# Patient Record
Sex: Male | Born: 1945 | Race: White | Hispanic: No | Marital: Married | Smoking: Never smoker
Health system: Southern US, Community
[De-identification: ages and names within clinical notes are randomized; demographics above are authoritative.]

## PROBLEM LIST (undated history)

## (undated) DIAGNOSIS — E119 Type 2 diabetes mellitus without complications: Secondary | ICD-10-CM

## (undated) DIAGNOSIS — Z8719 Personal history of other diseases of the digestive system: Secondary | ICD-10-CM

## (undated) DIAGNOSIS — G219 Secondary parkinsonism, unspecified: Secondary | ICD-10-CM

## (undated) DIAGNOSIS — I10 Essential (primary) hypertension: Secondary | ICD-10-CM

## (undated) DIAGNOSIS — E538 Deficiency of other specified B group vitamins: Secondary | ICD-10-CM

## (undated) DIAGNOSIS — F419 Anxiety disorder, unspecified: Secondary | ICD-10-CM

## (undated) DIAGNOSIS — F319 Bipolar disorder, unspecified: Secondary | ICD-10-CM

## (undated) DIAGNOSIS — E785 Hyperlipidemia, unspecified: Secondary | ICD-10-CM

## (undated) HISTORY — PX: COLONOSCOPY WITH PROPOFOL: SHX5780

## (undated) HISTORY — PX: AMPUTATION TOE: SHX6595

## (undated) HISTORY — PX: INSERT / REPLACE / REMOVE PACEMAKER: SUR710

## (undated) HISTORY — PX: APPENDECTOMY: SHX54

---

## 2006-02-10 ENCOUNTER — Ambulatory Visit: Payer: Self-pay | Admitting: Unknown Physician Specialty

## 2013-08-06 ENCOUNTER — Ambulatory Visit: Payer: Self-pay | Admitting: Neurology

## 2013-10-09 ENCOUNTER — Ambulatory Visit: Payer: Self-pay | Admitting: Cardiology

## 2013-10-09 LAB — BASIC METABOLIC PANEL
Anion Gap: 2 — ABNORMAL LOW (ref 7–16)
BUN: 14 mg/dL (ref 7–18)
Calcium, Total: 9.6 mg/dL (ref 8.5–10.1)
Chloride: 108 mmol/L — ABNORMAL HIGH (ref 98–107)
Co2: 30 mmol/L (ref 21–32)
Creatinine: 1.06 mg/dL (ref 0.60–1.30)
EGFR (African American): >60
EGFR (Non-African Amer.): >60
Glucose: 73 mg/dL (ref 65–99)
Osmolality: 278 (ref 275–301)
Potassium: 4.1 mmol/L (ref 3.5–5.1)
Sodium: 140 mmol/L (ref 136–145)

## 2013-10-09 LAB — CBC WITH DIFFERENTIAL/PLATELET
Basophil #: 0 10*3/uL (ref 0.0–0.1)
Basophil %: 0.3 %
Eosinophil %: 0.7 %
HCT: 44.1 % (ref 40.0–52.0)
HGB: 15.1 g/dL (ref 13.0–18.0)
Lymphocyte %: 20 %
MCH: 30.6 pg (ref 26.0–34.0)
MCHC: 34.3 g/dL (ref 32.0–36.0)
MCV: 89 fL (ref 80–100)
Monocyte #: 0.7 x10 3/mm (ref 0.2–1.0)
Monocyte %: 8.1 %
Platelet: 204 10*3/uL (ref 150–440)
RDW: 14.5 % (ref 11.5–14.5)

## 2013-10-09 LAB — PROTIME-INR: Prothrombin Time: 13.2 secs (ref 11.5–14.7)

## 2013-10-09 LAB — APTT: Activated PTT: 28.6 secs (ref 23.6–35.9)

## 2013-10-15 ENCOUNTER — Ambulatory Visit: Payer: Self-pay | Admitting: Cardiology

## 2014-02-11 DIAGNOSIS — C4491 Basal cell carcinoma of skin, unspecified: Secondary | ICD-10-CM

## 2014-02-11 HISTORY — DX: Basal cell carcinoma of skin, unspecified: C44.91

## 2014-03-04 IMAGING — CR DG CHEST 1V PORT
1 series · 2 of 2 positions shown · non-contrast
Comparison: 10/09/2013

CLINICAL DATA: Pacemaker insertion

EXAM:
PORTABLE CHEST - 1 VIEW

[Series 1: ap · 0.17mm/px · 2 of 2 slices shown]
[im 1/2]
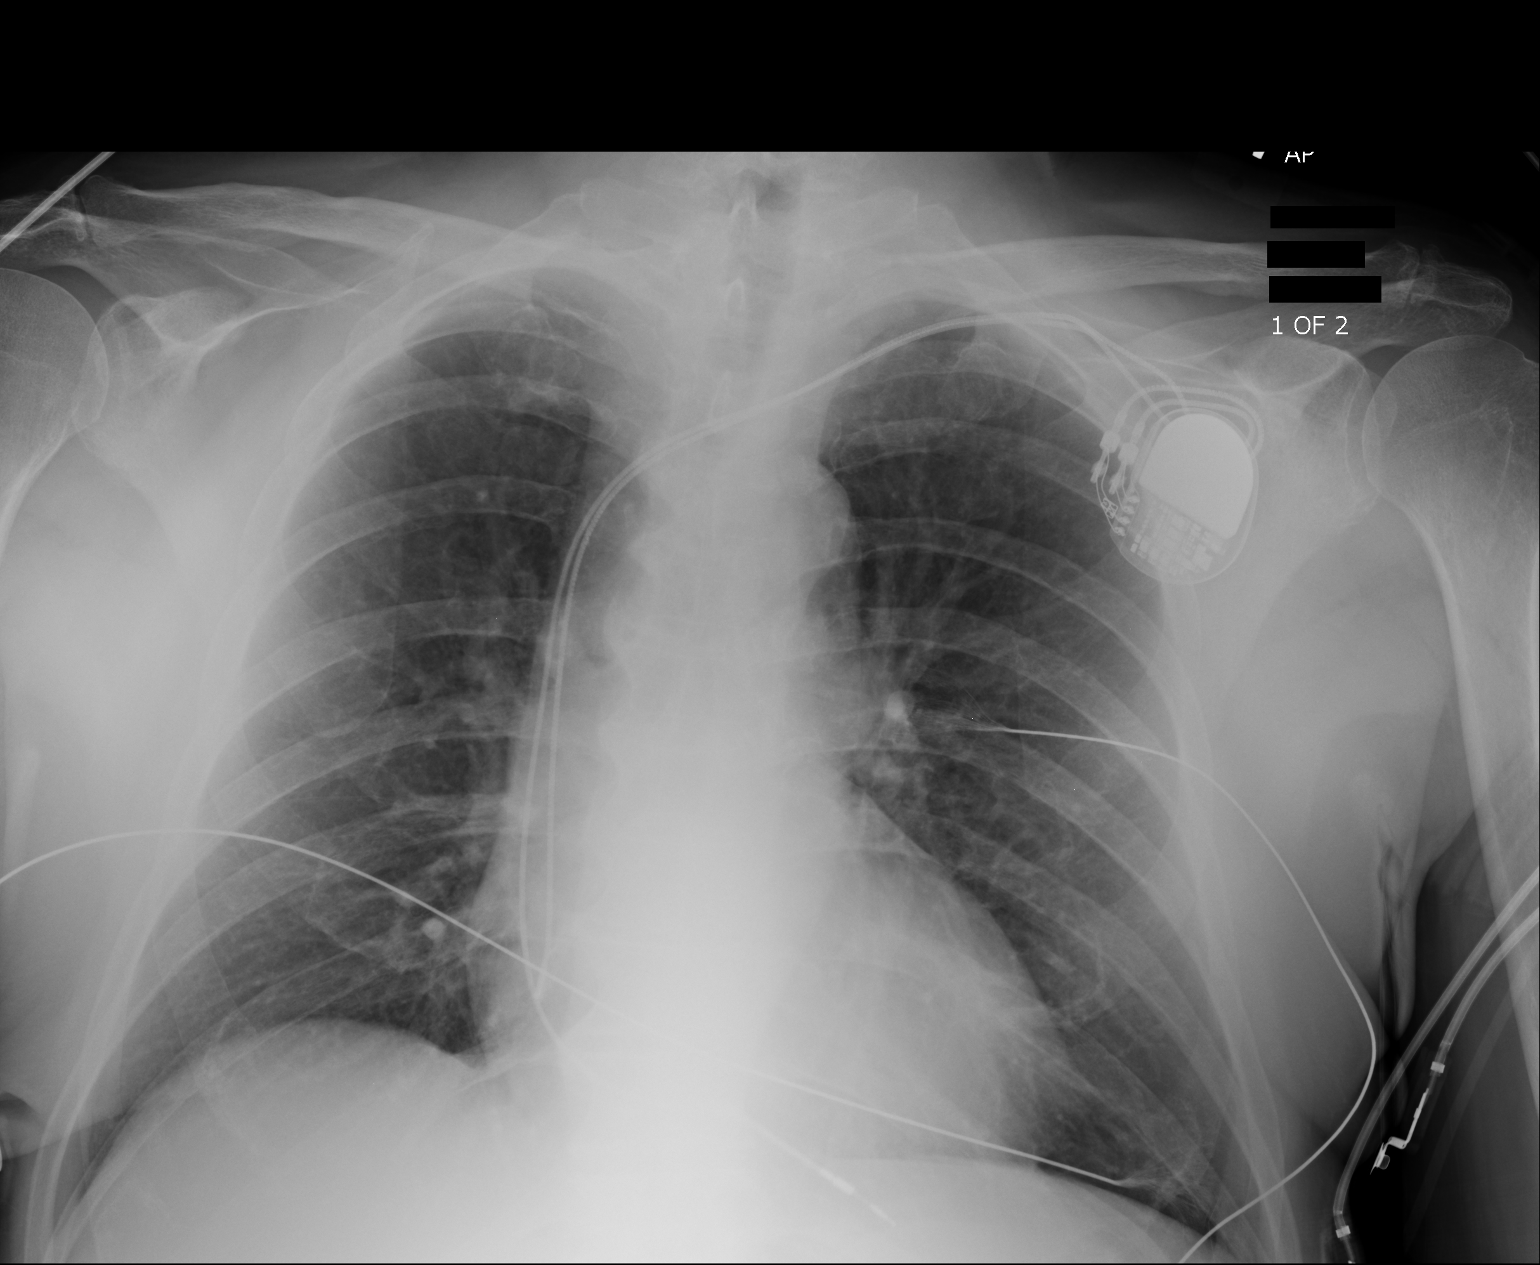
[im 2/2]
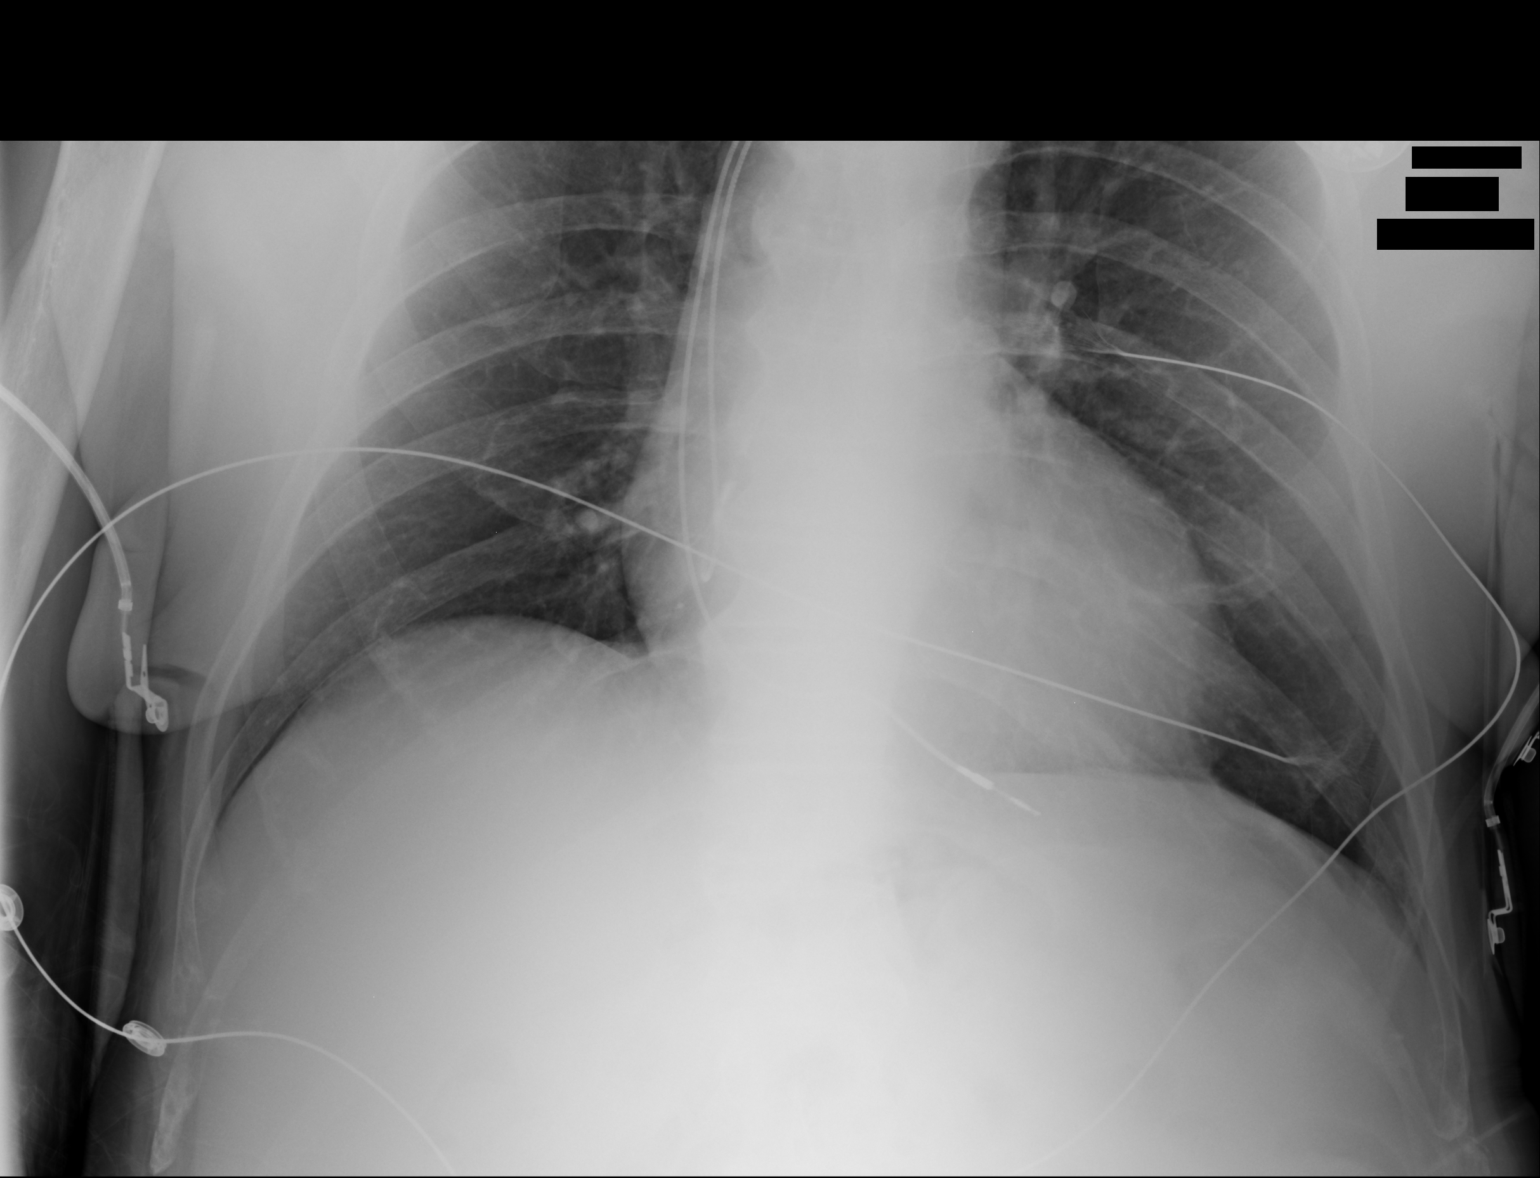

[2 of 2 positions shown; findings below may reference images not displayed]

FINDINGS: The subclavian 2 lead pacer has been inserted. No pneumothorax or
effusion. Stable heart size and vascularity. No CHF or pneumonia.
Degenerative changes of the spine. Tortuous aorta evident.
IMPRESSION: New left subclavian pacer.  No acute finding.

## 2014-06-25 ENCOUNTER — Ambulatory Visit: Payer: Self-pay | Admitting: Unknown Physician Specialty

## 2014-06-27 LAB — PATHOLOGY REPORT

## 2015-03-20 NOTE — Op Note (Signed)
PATIENT NAME:  Michael Bradshaw, Michael Bradshaw MR#:  [id number] DATE OF BIRTH:  1946-09-12  DATE OF PROCEDURE:  10/15/2013  PRIMARY CARE PHYSICIAN: Dr. Caryl Comes.  PREPROCEDURE DIAGNOSIS: Sick sinus syndrome.   PROCEDURE: Dual chamber pacemaker implantation.   POSTPROCEDURE DIAGNOSIS: Atrial pacing with ventricular sensing.   INDICATION: The patient is a 69 year old gentleman with known history of bradycardia. Recently noted to be bradycardic with heart rates in the 40s and 50s. A 24 hour Holter monitor was performed which revealed a mean heart rate of 44 bpm with a maximum heart rate of only 65 bpm consistent with poor chronotropic competence. The patient does experience generalized fatigue and leads a sedentary lifestyle. The procedure, risks, benefits, and alternatives of permanent pacemaker implantation were explained to the patient and informed written consent was obtained.   DESCRIPTION OF PROCEDURE: He was brought to the operating room in a fasting state. The left pectoral region was prepped and prepped in the usual sterile manner. Anesthesia was obtained with 1% Xylocaine locally. A 6 cm incision was performed over the left pectoral region. The pacemaker pocket was generated by electrocautery and blunt dissection. Access was obtained to the left subclavian vein by fine needle aspiration. Right ventricular and right atrial leads were positioned to right ventricular apex and right atrial appendage under fluoroscopic guidance. After proper thresholds were obtained, the leads were sutured in place. The pacemaker pocket was irrigated with gentamicin solution. The leads were connected to a rate-responsive dual-chamber pacemaker generator (Medtronic Adapta [id number]) and positioned into the pocket. The pocket was closed with 2-0 and 4-0 Vicryl, respectively. Steri-Strips and a pressure dressing were applied.  ____________________________ Isaias Cowman, MD ap:sb D: 10/15/2013 13:26:09 ET T: 10/15/2013 13:33:59  ET JOB#: [id number]  cc: Isaias Cowman, MD, <Dictator> Isaias Cowman MD ELECTRONICALLY SIGNED 10/16/2013 9:35

## 2015-03-20 NOTE — Op Note (Signed)
PATIENT NAME:  Michael Bradshaw, Michael Bradshaw MR#:  [id number] DATE OF BIRTH:  1946/07/27  DATE OF PROCEDURE:  10/15/2013  PRIMARY CARE PHYSICIAN: Tama High III, MD  PREPROCEDURE DIAGNOSIS:  Atrial lead malfunction.   PROCEDURE: Atrial lead revision.   POSTPROCEDURE DIAGNOSIS: Atrial pacing with ventricular sensing.   INDICATION: The patient is a 69 year old gentleman who underwent dual-chamber pacemaker implantation earlier today. While in recovery, it was noted that the atrial lead was inappropriately sensing and not pacing.   DESCRIPTION OF PROCEDURE: The  risks, benefits, and alternatives of atrial lead revision were explained to the patient, and informed consent was obtained. He was brought to the operating room in a fasting state. The left pectoral region was prepped and draped in the usual sterile manner. Anesthesia was obtained with 1% Xylocaine locally. The pacemaker pocket was reopened, and the atrial lead was disconnected from the pacemaker generator. After thresholds were obtained, it was felt that a screw-in atrial lead would be most appropriate. Access was obtained to the left subclavian vein by fine needle aspiration. Atrial screw-in lead (5076-52 cm) was positioned to the right atrial appendage under fluoroscopic guidance. After the proper threshold was obtained, the lead was sutured in place. The atrial lead was connected to the dual-chamber pacemaker generator and repositioned. The pacemaker pocket was irrigated with gentamicin solution. The pacemaker generator was repositioned in the pocket, and the pocket was closed with 2-0 and 4-0 Vicryl, respectively. Steri-Strips and pressure dressing were applied.    ____________________________ Isaias Cowman, MD ap:jcm D: 10/15/2013 16:42:36 ET T: 10/15/2013 17:02:50 ET JOB#: [id number]  cc: Isaias Cowman, MD, <Dictator> Isaias Cowman MD ELECTRONICALLY SIGNED 10/16/2013 9:35

## 2016-02-02 DIAGNOSIS — N182 Chronic kidney disease, stage 2 (mild): Secondary | ICD-10-CM | POA: Diagnosis not present

## 2016-02-02 DIAGNOSIS — E1122 Type 2 diabetes mellitus with diabetic chronic kidney disease: Secondary | ICD-10-CM | POA: Diagnosis not present

## 2016-02-02 DIAGNOSIS — F319 Bipolar disorder, unspecified: Secondary | ICD-10-CM | POA: Diagnosis not present

## 2016-02-02 DIAGNOSIS — E538 Deficiency of other specified B group vitamins: Secondary | ICD-10-CM | POA: Diagnosis not present

## 2016-02-09 DIAGNOSIS — Z125 Encounter for screening for malignant neoplasm of prostate: Secondary | ICD-10-CM | POA: Diagnosis not present

## 2016-02-09 DIAGNOSIS — F319 Bipolar disorder, unspecified: Secondary | ICD-10-CM | POA: Diagnosis not present

## 2016-02-09 DIAGNOSIS — N183 Chronic kidney disease, stage 3 (moderate): Secondary | ICD-10-CM | POA: Diagnosis not present

## 2016-02-09 DIAGNOSIS — I495 Sick sinus syndrome: Secondary | ICD-10-CM | POA: Diagnosis not present

## 2016-02-09 DIAGNOSIS — E1122 Type 2 diabetes mellitus with diabetic chronic kidney disease: Secondary | ICD-10-CM | POA: Diagnosis not present

## 2016-02-09 DIAGNOSIS — E538 Deficiency of other specified B group vitamins: Secondary | ICD-10-CM | POA: Diagnosis not present

## 2016-02-09 DIAGNOSIS — F419 Anxiety disorder, unspecified: Secondary | ICD-10-CM | POA: Diagnosis not present

## 2016-02-09 DIAGNOSIS — Z950 Presence of cardiac pacemaker: Secondary | ICD-10-CM | POA: Diagnosis not present

## 2016-02-09 DIAGNOSIS — D631 Anemia in chronic kidney disease: Secondary | ICD-10-CM | POA: Diagnosis not present

## 2016-02-09 DIAGNOSIS — I10 Essential (primary) hypertension: Secondary | ICD-10-CM | POA: Diagnosis not present

## 2016-02-09 DIAGNOSIS — E782 Mixed hyperlipidemia: Secondary | ICD-10-CM | POA: Diagnosis not present

## 2016-02-09 DIAGNOSIS — G2119 Other drug induced secondary parkinsonism: Secondary | ICD-10-CM | POA: Diagnosis not present

## 2016-05-03 DIAGNOSIS — E1122 Type 2 diabetes mellitus with diabetic chronic kidney disease: Secondary | ICD-10-CM | POA: Diagnosis not present

## 2016-05-03 DIAGNOSIS — N183 Chronic kidney disease, stage 3 (moderate): Secondary | ICD-10-CM | POA: Diagnosis not present

## 2016-05-03 DIAGNOSIS — E538 Deficiency of other specified B group vitamins: Secondary | ICD-10-CM | POA: Diagnosis not present

## 2016-05-03 DIAGNOSIS — D631 Anemia in chronic kidney disease: Secondary | ICD-10-CM | POA: Diagnosis not present

## 2016-05-03 DIAGNOSIS — I10 Essential (primary) hypertension: Secondary | ICD-10-CM | POA: Diagnosis not present

## 2016-05-03 DIAGNOSIS — Z125 Encounter for screening for malignant neoplasm of prostate: Secondary | ICD-10-CM | POA: Diagnosis not present

## 2016-05-10 DIAGNOSIS — E782 Mixed hyperlipidemia: Secondary | ICD-10-CM | POA: Diagnosis not present

## 2016-05-10 DIAGNOSIS — F419 Anxiety disorder, unspecified: Secondary | ICD-10-CM | POA: Diagnosis not present

## 2016-05-10 DIAGNOSIS — E1122 Type 2 diabetes mellitus with diabetic chronic kidney disease: Secondary | ICD-10-CM | POA: Diagnosis not present

## 2016-05-10 DIAGNOSIS — Z950 Presence of cardiac pacemaker: Secondary | ICD-10-CM | POA: Diagnosis not present

## 2016-05-10 DIAGNOSIS — N183 Chronic kidney disease, stage 3 (moderate): Secondary | ICD-10-CM | POA: Diagnosis not present

## 2016-05-10 DIAGNOSIS — I10 Essential (primary) hypertension: Secondary | ICD-10-CM | POA: Diagnosis not present

## 2016-05-10 DIAGNOSIS — E538 Deficiency of other specified B group vitamins: Secondary | ICD-10-CM | POA: Diagnosis not present

## 2016-05-10 DIAGNOSIS — G2119 Other drug induced secondary parkinsonism: Secondary | ICD-10-CM | POA: Diagnosis not present

## 2016-05-10 DIAGNOSIS — F319 Bipolar disorder, unspecified: Secondary | ICD-10-CM | POA: Diagnosis not present

## 2016-06-10 DIAGNOSIS — I10 Essential (primary) hypertension: Secondary | ICD-10-CM | POA: Diagnosis not present

## 2016-06-10 DIAGNOSIS — Z0000 Encounter for general adult medical examination without abnormal findings: Secondary | ICD-10-CM | POA: Diagnosis not present

## 2016-06-10 DIAGNOSIS — I495 Sick sinus syndrome: Secondary | ICD-10-CM | POA: Diagnosis not present

## 2016-06-10 DIAGNOSIS — E782 Mixed hyperlipidemia: Secondary | ICD-10-CM | POA: Diagnosis not present

## 2016-07-07 DIAGNOSIS — F3175 Bipolar disorder, in partial remission, most recent episode depressed: Secondary | ICD-10-CM | POA: Diagnosis not present

## 2016-08-11 DIAGNOSIS — E782 Mixed hyperlipidemia: Secondary | ICD-10-CM | POA: Diagnosis not present

## 2016-08-11 DIAGNOSIS — I10 Essential (primary) hypertension: Secondary | ICD-10-CM | POA: Diagnosis not present

## 2016-08-11 DIAGNOSIS — I495 Sick sinus syndrome: Secondary | ICD-10-CM | POA: Diagnosis not present

## 2016-08-11 DIAGNOSIS — E1122 Type 2 diabetes mellitus with diabetic chronic kidney disease: Secondary | ICD-10-CM | POA: Diagnosis not present

## 2016-08-11 DIAGNOSIS — N183 Chronic kidney disease, stage 3 (moderate): Secondary | ICD-10-CM | POA: Diagnosis not present

## 2016-11-08 DIAGNOSIS — E538 Deficiency of other specified B group vitamins: Secondary | ICD-10-CM | POA: Diagnosis not present

## 2016-11-08 DIAGNOSIS — G2119 Other drug induced secondary parkinsonism: Secondary | ICD-10-CM | POA: Diagnosis not present

## 2016-11-08 DIAGNOSIS — E1122 Type 2 diabetes mellitus with diabetic chronic kidney disease: Secondary | ICD-10-CM | POA: Diagnosis not present

## 2016-11-08 DIAGNOSIS — F319 Bipolar disorder, unspecified: Secondary | ICD-10-CM | POA: Diagnosis not present

## 2016-11-08 DIAGNOSIS — N183 Chronic kidney disease, stage 3 (moderate): Secondary | ICD-10-CM | POA: Diagnosis not present

## 2016-11-08 DIAGNOSIS — I10 Essential (primary) hypertension: Secondary | ICD-10-CM | POA: Diagnosis not present

## 2016-11-15 DIAGNOSIS — F319 Bipolar disorder, unspecified: Secondary | ICD-10-CM | POA: Diagnosis not present

## 2016-11-15 DIAGNOSIS — G2119 Other drug induced secondary parkinsonism: Secondary | ICD-10-CM | POA: Diagnosis not present

## 2016-11-15 DIAGNOSIS — I495 Sick sinus syndrome: Secondary | ICD-10-CM | POA: Diagnosis not present

## 2016-11-15 DIAGNOSIS — E538 Deficiency of other specified B group vitamins: Secondary | ICD-10-CM | POA: Diagnosis not present

## 2016-11-15 DIAGNOSIS — E782 Mixed hyperlipidemia: Secondary | ICD-10-CM | POA: Diagnosis not present

## 2016-11-15 DIAGNOSIS — E1122 Type 2 diabetes mellitus with diabetic chronic kidney disease: Secondary | ICD-10-CM | POA: Diagnosis not present

## 2016-11-15 DIAGNOSIS — N183 Chronic kidney disease, stage 3 (moderate): Secondary | ICD-10-CM | POA: Diagnosis not present

## 2016-11-15 DIAGNOSIS — Z950 Presence of cardiac pacemaker: Secondary | ICD-10-CM | POA: Diagnosis not present

## 2016-11-15 DIAGNOSIS — I10 Essential (primary) hypertension: Secondary | ICD-10-CM | POA: Diagnosis not present

## 2016-11-15 DIAGNOSIS — Z125 Encounter for screening for malignant neoplasm of prostate: Secondary | ICD-10-CM | POA: Diagnosis not present

## 2017-05-09 DIAGNOSIS — N183 Chronic kidney disease, stage 3 (moderate): Secondary | ICD-10-CM | POA: Diagnosis not present

## 2017-05-09 DIAGNOSIS — E538 Deficiency of other specified B group vitamins: Secondary | ICD-10-CM | POA: Diagnosis not present

## 2017-05-09 DIAGNOSIS — Z125 Encounter for screening for malignant neoplasm of prostate: Secondary | ICD-10-CM | POA: Diagnosis not present

## 2017-05-09 DIAGNOSIS — I10 Essential (primary) hypertension: Secondary | ICD-10-CM | POA: Diagnosis not present

## 2017-05-09 DIAGNOSIS — E1122 Type 2 diabetes mellitus with diabetic chronic kidney disease: Secondary | ICD-10-CM | POA: Diagnosis not present

## 2017-05-16 DIAGNOSIS — E782 Mixed hyperlipidemia: Secondary | ICD-10-CM | POA: Diagnosis not present

## 2017-05-16 DIAGNOSIS — E538 Deficiency of other specified B group vitamins: Secondary | ICD-10-CM | POA: Diagnosis not present

## 2017-05-16 DIAGNOSIS — Z89421 Acquired absence of other right toe(s): Secondary | ICD-10-CM | POA: Diagnosis not present

## 2017-05-16 DIAGNOSIS — Z0000 Encounter for general adult medical examination without abnormal findings: Secondary | ICD-10-CM | POA: Diagnosis not present

## 2017-05-16 DIAGNOSIS — F419 Anxiety disorder, unspecified: Secondary | ICD-10-CM | POA: Diagnosis not present

## 2017-05-16 DIAGNOSIS — Z950 Presence of cardiac pacemaker: Secondary | ICD-10-CM | POA: Diagnosis not present

## 2017-05-16 DIAGNOSIS — I10 Essential (primary) hypertension: Secondary | ICD-10-CM | POA: Diagnosis not present

## 2017-05-16 DIAGNOSIS — E1122 Type 2 diabetes mellitus with diabetic chronic kidney disease: Secondary | ICD-10-CM | POA: Diagnosis not present

## 2017-05-16 DIAGNOSIS — N183 Chronic kidney disease, stage 3 (moderate): Secondary | ICD-10-CM | POA: Diagnosis not present

## 2017-05-16 DIAGNOSIS — F319 Bipolar disorder, unspecified: Secondary | ICD-10-CM | POA: Diagnosis not present

## 2017-05-23 DIAGNOSIS — I495 Sick sinus syndrome: Secondary | ICD-10-CM | POA: Diagnosis not present

## 2017-09-06 DIAGNOSIS — F3175 Bipolar disorder, in partial remission, most recent episode depressed: Secondary | ICD-10-CM | POA: Diagnosis not present

## 2017-11-07 DIAGNOSIS — I495 Sick sinus syndrome: Secondary | ICD-10-CM | POA: Diagnosis not present

## 2017-11-09 DIAGNOSIS — N183 Chronic kidney disease, stage 3 (moderate): Secondary | ICD-10-CM | POA: Diagnosis not present

## 2017-11-09 DIAGNOSIS — E1122 Type 2 diabetes mellitus with diabetic chronic kidney disease: Secondary | ICD-10-CM | POA: Diagnosis not present

## 2017-11-09 DIAGNOSIS — I10 Essential (primary) hypertension: Secondary | ICD-10-CM | POA: Diagnosis not present

## 2017-11-09 DIAGNOSIS — E538 Deficiency of other specified B group vitamins: Secondary | ICD-10-CM | POA: Diagnosis not present

## 2017-11-15 DIAGNOSIS — Z89421 Acquired absence of other right toe(s): Secondary | ICD-10-CM | POA: Diagnosis not present

## 2017-11-15 DIAGNOSIS — I495 Sick sinus syndrome: Secondary | ICD-10-CM | POA: Diagnosis not present

## 2017-11-15 DIAGNOSIS — E782 Mixed hyperlipidemia: Secondary | ICD-10-CM | POA: Diagnosis not present

## 2017-11-15 DIAGNOSIS — E1122 Type 2 diabetes mellitus with diabetic chronic kidney disease: Secondary | ICD-10-CM | POA: Diagnosis not present

## 2017-11-15 DIAGNOSIS — F319 Bipolar disorder, unspecified: Secondary | ICD-10-CM | POA: Diagnosis not present

## 2017-11-15 DIAGNOSIS — N183 Chronic kidney disease, stage 3 (moderate): Secondary | ICD-10-CM | POA: Diagnosis not present

## 2017-11-15 DIAGNOSIS — Z125 Encounter for screening for malignant neoplasm of prostate: Secondary | ICD-10-CM | POA: Diagnosis not present

## 2017-11-15 DIAGNOSIS — E538 Deficiency of other specified B group vitamins: Secondary | ICD-10-CM | POA: Diagnosis not present

## 2017-11-15 DIAGNOSIS — I10 Essential (primary) hypertension: Secondary | ICD-10-CM | POA: Diagnosis not present

## 2018-05-10 DIAGNOSIS — E782 Mixed hyperlipidemia: Secondary | ICD-10-CM | POA: Diagnosis not present

## 2018-05-10 DIAGNOSIS — N183 Chronic kidney disease, stage 3 (moderate): Secondary | ICD-10-CM | POA: Diagnosis not present

## 2018-05-10 DIAGNOSIS — E1122 Type 2 diabetes mellitus with diabetic chronic kidney disease: Secondary | ICD-10-CM | POA: Diagnosis not present

## 2018-05-10 DIAGNOSIS — E538 Deficiency of other specified B group vitamins: Secondary | ICD-10-CM | POA: Diagnosis not present

## 2018-05-10 DIAGNOSIS — Z125 Encounter for screening for malignant neoplasm of prostate: Secondary | ICD-10-CM | POA: Diagnosis not present

## 2018-05-17 DIAGNOSIS — F419 Anxiety disorder, unspecified: Secondary | ICD-10-CM | POA: Diagnosis not present

## 2018-05-17 DIAGNOSIS — I10 Essential (primary) hypertension: Secondary | ICD-10-CM | POA: Diagnosis not present

## 2018-05-17 DIAGNOSIS — Z0000 Encounter for general adult medical examination without abnormal findings: Secondary | ICD-10-CM | POA: Diagnosis not present

## 2018-05-17 DIAGNOSIS — I495 Sick sinus syndrome: Secondary | ICD-10-CM | POA: Diagnosis not present

## 2018-05-17 DIAGNOSIS — E1122 Type 2 diabetes mellitus with diabetic chronic kidney disease: Secondary | ICD-10-CM | POA: Diagnosis not present

## 2018-05-17 DIAGNOSIS — E782 Mixed hyperlipidemia: Secondary | ICD-10-CM | POA: Diagnosis not present

## 2018-05-17 DIAGNOSIS — F319 Bipolar disorder, unspecified: Secondary | ICD-10-CM | POA: Diagnosis not present

## 2018-05-17 DIAGNOSIS — N183 Chronic kidney disease, stage 3 (moderate): Secondary | ICD-10-CM | POA: Diagnosis not present

## 2018-05-17 DIAGNOSIS — Z89421 Acquired absence of other right toe(s): Secondary | ICD-10-CM | POA: Diagnosis not present

## 2018-05-17 DIAGNOSIS — Z950 Presence of cardiac pacemaker: Secondary | ICD-10-CM | POA: Diagnosis not present

## 2018-05-17 DIAGNOSIS — N200 Calculus of kidney: Secondary | ICD-10-CM | POA: Diagnosis not present

## 2018-05-17 DIAGNOSIS — E538 Deficiency of other specified B group vitamins: Secondary | ICD-10-CM | POA: Diagnosis not present

## 2018-09-05 ENCOUNTER — Ambulatory Visit: Payer: Self-pay | Admitting: Psychiatry

## 2018-11-06 DIAGNOSIS — E538 Deficiency of other specified B group vitamins: Secondary | ICD-10-CM | POA: Diagnosis not present

## 2018-11-06 DIAGNOSIS — E782 Mixed hyperlipidemia: Secondary | ICD-10-CM | POA: Diagnosis not present

## 2018-11-06 DIAGNOSIS — E1122 Type 2 diabetes mellitus with diabetic chronic kidney disease: Secondary | ICD-10-CM | POA: Diagnosis not present

## 2018-11-06 DIAGNOSIS — I10 Essential (primary) hypertension: Secondary | ICD-10-CM | POA: Diagnosis not present

## 2018-11-06 DIAGNOSIS — N183 Chronic kidney disease, stage 3 (moderate): Secondary | ICD-10-CM | POA: Diagnosis not present

## 2018-11-13 DIAGNOSIS — E538 Deficiency of other specified B group vitamins: Secondary | ICD-10-CM | POA: Diagnosis not present

## 2018-11-13 DIAGNOSIS — F319 Bipolar disorder, unspecified: Secondary | ICD-10-CM | POA: Diagnosis not present

## 2018-11-13 DIAGNOSIS — E1122 Type 2 diabetes mellitus with diabetic chronic kidney disease: Secondary | ICD-10-CM | POA: Diagnosis not present

## 2018-11-13 DIAGNOSIS — N200 Calculus of kidney: Secondary | ICD-10-CM | POA: Diagnosis not present

## 2018-11-13 DIAGNOSIS — N183 Chronic kidney disease, stage 3 (moderate): Secondary | ICD-10-CM | POA: Diagnosis not present

## 2018-11-13 DIAGNOSIS — I10 Essential (primary) hypertension: Secondary | ICD-10-CM | POA: Diagnosis not present

## 2018-11-13 DIAGNOSIS — Z125 Encounter for screening for malignant neoplasm of prostate: Secondary | ICD-10-CM | POA: Diagnosis not present

## 2018-11-13 DIAGNOSIS — E782 Mixed hyperlipidemia: Secondary | ICD-10-CM | POA: Diagnosis not present

## 2018-11-13 DIAGNOSIS — Z89421 Acquired absence of other right toe(s): Secondary | ICD-10-CM | POA: Diagnosis not present

## 2019-05-14 DIAGNOSIS — N183 Chronic kidney disease, stage 3 (moderate): Secondary | ICD-10-CM | POA: Diagnosis not present

## 2019-05-14 DIAGNOSIS — E538 Deficiency of other specified B group vitamins: Secondary | ICD-10-CM | POA: Diagnosis not present

## 2019-05-14 DIAGNOSIS — Z125 Encounter for screening for malignant neoplasm of prostate: Secondary | ICD-10-CM | POA: Diagnosis not present

## 2019-05-14 DIAGNOSIS — E782 Mixed hyperlipidemia: Secondary | ICD-10-CM | POA: Diagnosis not present

## 2019-05-14 DIAGNOSIS — E1122 Type 2 diabetes mellitus with diabetic chronic kidney disease: Secondary | ICD-10-CM | POA: Diagnosis not present

## 2019-05-21 DIAGNOSIS — E782 Mixed hyperlipidemia: Secondary | ICD-10-CM | POA: Diagnosis not present

## 2019-05-21 DIAGNOSIS — R51 Headache: Secondary | ICD-10-CM | POA: Diagnosis not present

## 2019-05-21 DIAGNOSIS — N183 Chronic kidney disease, stage 3 (moderate): Secondary | ICD-10-CM | POA: Diagnosis not present

## 2019-05-21 DIAGNOSIS — I10 Essential (primary) hypertension: Secondary | ICD-10-CM | POA: Diagnosis not present

## 2019-05-21 DIAGNOSIS — E538 Deficiency of other specified B group vitamins: Secondary | ICD-10-CM | POA: Diagnosis not present

## 2019-05-21 DIAGNOSIS — Z950 Presence of cardiac pacemaker: Secondary | ICD-10-CM | POA: Diagnosis not present

## 2019-05-21 DIAGNOSIS — E1122 Type 2 diabetes mellitus with diabetic chronic kidney disease: Secondary | ICD-10-CM | POA: Diagnosis not present

## 2019-05-21 DIAGNOSIS — Z0000 Encounter for general adult medical examination without abnormal findings: Secondary | ICD-10-CM | POA: Diagnosis not present

## 2019-05-21 DIAGNOSIS — I495 Sick sinus syndrome: Secondary | ICD-10-CM | POA: Diagnosis not present

## 2019-05-21 DIAGNOSIS — Z89421 Acquired absence of other right toe(s): Secondary | ICD-10-CM | POA: Diagnosis not present

## 2019-05-21 DIAGNOSIS — F319 Bipolar disorder, unspecified: Secondary | ICD-10-CM | POA: Diagnosis not present

## 2019-06-10 DIAGNOSIS — H353131 Nonexudative age-related macular degeneration, bilateral, early dry stage: Secondary | ICD-10-CM | POA: Diagnosis not present

## 2019-06-10 DIAGNOSIS — E119 Type 2 diabetes mellitus without complications: Secondary | ICD-10-CM | POA: Diagnosis not present

## 2019-08-15 DIAGNOSIS — E538 Deficiency of other specified B group vitamins: Secondary | ICD-10-CM | POA: Diagnosis not present

## 2019-08-15 DIAGNOSIS — N183 Chronic kidney disease, stage 3 (moderate): Secondary | ICD-10-CM | POA: Diagnosis not present

## 2019-08-15 DIAGNOSIS — E1122 Type 2 diabetes mellitus with diabetic chronic kidney disease: Secondary | ICD-10-CM | POA: Diagnosis not present

## 2019-08-15 DIAGNOSIS — E782 Mixed hyperlipidemia: Secondary | ICD-10-CM | POA: Diagnosis not present

## 2019-08-22 DIAGNOSIS — E1122 Type 2 diabetes mellitus with diabetic chronic kidney disease: Secondary | ICD-10-CM | POA: Diagnosis not present

## 2019-08-22 DIAGNOSIS — E538 Deficiency of other specified B group vitamins: Secondary | ICD-10-CM | POA: Diagnosis not present

## 2019-08-22 DIAGNOSIS — Z950 Presence of cardiac pacemaker: Secondary | ICD-10-CM | POA: Diagnosis not present

## 2019-08-22 DIAGNOSIS — N183 Chronic kidney disease, stage 3 (moderate): Secondary | ICD-10-CM | POA: Diagnosis not present

## 2019-08-22 DIAGNOSIS — I10 Essential (primary) hypertension: Secondary | ICD-10-CM | POA: Diagnosis not present

## 2019-08-22 DIAGNOSIS — Z23 Encounter for immunization: Secondary | ICD-10-CM | POA: Diagnosis not present

## 2019-08-22 DIAGNOSIS — Z89421 Acquired absence of other right toe(s): Secondary | ICD-10-CM | POA: Diagnosis not present

## 2019-08-22 DIAGNOSIS — N200 Calculus of kidney: Secondary | ICD-10-CM | POA: Diagnosis not present

## 2019-08-22 DIAGNOSIS — F319 Bipolar disorder, unspecified: Secondary | ICD-10-CM | POA: Diagnosis not present

## 2019-09-19 DIAGNOSIS — E1121 Type 2 diabetes mellitus with diabetic nephropathy: Secondary | ICD-10-CM | POA: Diagnosis not present

## 2019-09-19 DIAGNOSIS — E782 Mixed hyperlipidemia: Secondary | ICD-10-CM | POA: Diagnosis not present

## 2019-09-19 DIAGNOSIS — I495 Sick sinus syndrome: Secondary | ICD-10-CM | POA: Diagnosis not present

## 2019-09-19 DIAGNOSIS — N1831 Chronic kidney disease, stage 3a: Secondary | ICD-10-CM | POA: Diagnosis not present

## 2019-09-19 DIAGNOSIS — Z950 Presence of cardiac pacemaker: Secondary | ICD-10-CM | POA: Diagnosis not present

## 2019-09-19 DIAGNOSIS — I10 Essential (primary) hypertension: Secondary | ICD-10-CM | POA: Diagnosis not present

## 2020-01-01 DIAGNOSIS — I495 Sick sinus syndrome: Secondary | ICD-10-CM | POA: Diagnosis not present

## 2020-01-14 DIAGNOSIS — F319 Bipolar disorder, unspecified: Secondary | ICD-10-CM | POA: Diagnosis not present

## 2020-01-14 DIAGNOSIS — E1122 Type 2 diabetes mellitus with diabetic chronic kidney disease: Secondary | ICD-10-CM | POA: Diagnosis not present

## 2020-01-14 DIAGNOSIS — I10 Essential (primary) hypertension: Secondary | ICD-10-CM | POA: Diagnosis not present

## 2020-01-14 DIAGNOSIS — N183 Chronic kidney disease, stage 3 unspecified: Secondary | ICD-10-CM | POA: Diagnosis not present

## 2020-01-14 DIAGNOSIS — E538 Deficiency of other specified B group vitamins: Secondary | ICD-10-CM | POA: Diagnosis not present

## 2020-01-21 DIAGNOSIS — N1831 Chronic kidney disease, stage 3a: Secondary | ICD-10-CM | POA: Diagnosis not present

## 2020-01-21 DIAGNOSIS — Z950 Presence of cardiac pacemaker: Secondary | ICD-10-CM | POA: Diagnosis not present

## 2020-01-21 DIAGNOSIS — E785 Hyperlipidemia, unspecified: Secondary | ICD-10-CM | POA: Diagnosis not present

## 2020-01-21 DIAGNOSIS — I10 Essential (primary) hypertension: Secondary | ICD-10-CM | POA: Diagnosis not present

## 2020-01-21 DIAGNOSIS — E1122 Type 2 diabetes mellitus with diabetic chronic kidney disease: Secondary | ICD-10-CM | POA: Diagnosis not present

## 2020-01-21 DIAGNOSIS — Z89421 Acquired absence of other right toe(s): Secondary | ICD-10-CM | POA: Diagnosis not present

## 2020-01-22 DIAGNOSIS — N1831 Chronic kidney disease, stage 3a: Secondary | ICD-10-CM | POA: Diagnosis not present

## 2020-01-22 DIAGNOSIS — I10 Essential (primary) hypertension: Secondary | ICD-10-CM | POA: Diagnosis not present

## 2020-01-22 DIAGNOSIS — E1121 Type 2 diabetes mellitus with diabetic nephropathy: Secondary | ICD-10-CM | POA: Diagnosis not present

## 2020-01-22 DIAGNOSIS — M95 Other acquired deformities of musculoskeletal system and connective tissue: Secondary | ICD-10-CM | POA: Diagnosis not present

## 2020-01-22 DIAGNOSIS — I495 Sick sinus syndrome: Secondary | ICD-10-CM | POA: Diagnosis not present

## 2020-01-22 DIAGNOSIS — E782 Mixed hyperlipidemia: Secondary | ICD-10-CM | POA: Diagnosis not present

## 2020-05-28 DIAGNOSIS — E785 Hyperlipidemia, unspecified: Secondary | ICD-10-CM | POA: Diagnosis not present

## 2020-05-28 DIAGNOSIS — E1122 Type 2 diabetes mellitus with diabetic chronic kidney disease: Secondary | ICD-10-CM | POA: Diagnosis not present

## 2020-05-28 DIAGNOSIS — E538 Deficiency of other specified B group vitamins: Secondary | ICD-10-CM | POA: Diagnosis not present

## 2020-05-28 DIAGNOSIS — N1831 Chronic kidney disease, stage 3a: Secondary | ICD-10-CM | POA: Diagnosis not present

## 2020-06-04 DIAGNOSIS — E538 Deficiency of other specified B group vitamins: Secondary | ICD-10-CM | POA: Diagnosis not present

## 2020-06-04 DIAGNOSIS — I129 Hypertensive chronic kidney disease with stage 1 through stage 4 chronic kidney disease, or unspecified chronic kidney disease: Secondary | ICD-10-CM | POA: Diagnosis not present

## 2020-06-04 DIAGNOSIS — D649 Anemia, unspecified: Secondary | ICD-10-CM | POA: Diagnosis not present

## 2020-06-04 DIAGNOSIS — R001 Bradycardia, unspecified: Secondary | ICD-10-CM | POA: Diagnosis not present

## 2020-06-04 DIAGNOSIS — N1831 Chronic kidney disease, stage 3a: Secondary | ICD-10-CM | POA: Diagnosis not present

## 2020-06-04 DIAGNOSIS — E1122 Type 2 diabetes mellitus with diabetic chronic kidney disease: Secondary | ICD-10-CM | POA: Diagnosis not present

## 2020-06-04 DIAGNOSIS — F319 Bipolar disorder, unspecified: Secondary | ICD-10-CM | POA: Diagnosis not present

## 2020-06-04 DIAGNOSIS — Z0000 Encounter for general adult medical examination without abnormal findings: Secondary | ICD-10-CM | POA: Diagnosis not present

## 2020-06-04 DIAGNOSIS — E782 Mixed hyperlipidemia: Secondary | ICD-10-CM | POA: Diagnosis not present

## 2020-06-09 DIAGNOSIS — H353131 Nonexudative age-related macular degeneration, bilateral, early dry stage: Secondary | ICD-10-CM | POA: Diagnosis not present

## 2020-06-09 DIAGNOSIS — E119 Type 2 diabetes mellitus without complications: Secondary | ICD-10-CM | POA: Diagnosis not present

## 2020-06-09 DIAGNOSIS — H2513 Age-related nuclear cataract, bilateral: Secondary | ICD-10-CM | POA: Diagnosis not present

## 2020-08-04 DIAGNOSIS — Z950 Presence of cardiac pacemaker: Secondary | ICD-10-CM | POA: Diagnosis not present

## 2020-08-04 DIAGNOSIS — Z86010 Personal history of colonic polyps: Secondary | ICD-10-CM | POA: Diagnosis not present

## 2020-08-04 DIAGNOSIS — F319 Bipolar disorder, unspecified: Secondary | ICD-10-CM | POA: Diagnosis not present

## 2020-09-25 ENCOUNTER — Other Ambulatory Visit: Payer: Self-pay

## 2020-09-25 ENCOUNTER — Other Ambulatory Visit
Admission: RE | Admit: 2020-09-25 | Discharge: 2020-09-25 | Disposition: A | Discharge status: Home / Self Care | Payer: Medicare HMO | Source: Ambulatory Visit | Attending: Gastroenterology | Admitting: Gastroenterology

## 2020-09-25 DIAGNOSIS — Z01812 Encounter for preprocedural laboratory examination: Secondary | ICD-10-CM | POA: Diagnosis not present

## 2020-09-25 DIAGNOSIS — Z20822 Contact with and (suspected) exposure to covid-19: Secondary | ICD-10-CM | POA: Insufficient documentation

## 2020-09-25 LAB — SARS CORONAVIRUS 2 (TAT 6-24 HRS): SARS Coronavirus 2: NEGATIVE

## 2020-09-29 ENCOUNTER — Ambulatory Visit
Admission: RE | Admit: 2020-09-29 | Discharge: 2020-09-29 | Disposition: A | Discharge status: Home / Self Care | Payer: Medicare HMO | Attending: Gastroenterology | Admitting: Gastroenterology

## 2020-09-29 ENCOUNTER — Encounter: Payer: Self-pay | Admitting: *Deleted

## 2020-09-29 ENCOUNTER — Encounter
Admission: RE | Disposition: A | Discharge status: Home / Self Care | Payer: Self-pay | Source: Home / Self Care | Attending: Gastroenterology

## 2020-09-29 ENCOUNTER — Ambulatory Visit: Payer: Medicare HMO | Admitting: Anesthesiology

## 2020-09-29 ENCOUNTER — Other Ambulatory Visit: Payer: Self-pay

## 2020-09-29 DIAGNOSIS — Z888 Allergy status to other drugs, medicaments and biological substances status: Secondary | ICD-10-CM | POA: Insufficient documentation

## 2020-09-29 DIAGNOSIS — Z86010 Personal history of colonic polyps: Secondary | ICD-10-CM | POA: Insufficient documentation

## 2020-09-29 DIAGNOSIS — Z1211 Encounter for screening for malignant neoplasm of colon: Secondary | ICD-10-CM | POA: Diagnosis not present

## 2020-09-29 DIAGNOSIS — Z7984 Long term (current) use of oral hypoglycemic drugs: Secondary | ICD-10-CM | POA: Insufficient documentation

## 2020-09-29 DIAGNOSIS — K640 First degree hemorrhoids: Secondary | ICD-10-CM | POA: Insufficient documentation

## 2020-09-29 DIAGNOSIS — Z79899 Other long term (current) drug therapy: Secondary | ICD-10-CM | POA: Diagnosis not present

## 2020-09-29 DIAGNOSIS — Z538 Procedure and treatment not carried out for other reasons: Secondary | ICD-10-CM | POA: Diagnosis not present

## 2020-09-29 DIAGNOSIS — E119 Type 2 diabetes mellitus without complications: Secondary | ICD-10-CM | POA: Insufficient documentation

## 2020-09-29 HISTORY — DX: Secondary parkinsonism, unspecified: G21.9

## 2020-09-29 HISTORY — DX: Deficiency of other specified B group vitamins: E53.8

## 2020-09-29 HISTORY — PX: COLONOSCOPY WITH PROPOFOL: SHX5780

## 2020-09-29 HISTORY — DX: Essential (primary) hypertension: I10

## 2020-09-29 HISTORY — DX: Hyperlipidemia, unspecified: E78.5

## 2020-09-29 HISTORY — DX: Bipolar disorder, unspecified: F31.9

## 2020-09-29 HISTORY — DX: Personal history of other diseases of the digestive system: Z87.19

## 2020-09-29 HISTORY — DX: Type 2 diabetes mellitus without complications: E11.9

## 2020-09-29 HISTORY — DX: Anxiety disorder, unspecified: F41.9

## 2020-09-29 LAB — GLUCOSE, CAPILLARY: Glucose-Capillary: 161 mg/dL — ABNORMAL HIGH (ref 70–99)

## 2020-09-29 SURGERY — COLONOSCOPY WITH PROPOFOL
Anesthesia: General

## 2020-09-29 MED ORDER — PROPOFOL 10 MG/ML IV BOLUS
INTRAVENOUS | Status: DC | PRN
  Administered 2020-09-29: 70 mg via INTRAVENOUS

## 2020-09-29 MED ORDER — PROPOFOL 500 MG/50ML IV EMUL
INTRAVENOUS | Status: DC | PRN
  Administered 2020-09-29: 200 ug/kg/min via INTRAVENOUS

## 2020-09-29 MED ORDER — SODIUM CHLORIDE 0.9 % IV SOLN: INTRAVENOUS | Status: DC

## 2020-09-29 MED ORDER — PROPOFOL 10 MG/ML IV BOLUS
INTRAVENOUS | Status: AC
  Filled 2020-09-29: qty 20

## 2020-09-29 MED ORDER — PHENYLEPHRINE HCL (PRESSORS) 10 MG/ML IV SOLN
INTRAVENOUS | Status: DC | PRN
  Administered 2020-09-29: 100 ug via INTRAVENOUS

## 2020-09-29 MED ORDER — PROPOFOL 500 MG/50ML IV EMUL
INTRAVENOUS | Status: AC
  Filled 2020-09-29: qty 50

## 2020-09-29 NOTE — Interval H&P Note (Signed)
History and Physical Interval Note:  09/29/2020 8:03 AM  Michael Bradshaw  has presented today for surgery, with the diagnosis of HX ADEN POLYPS.  The various methods of treatment have been discussed with the patient and family. After consideration of risks, benefits and other options for treatment, the patient has consented to  Procedure(s): COLONOSCOPY WITH PROPOFOL (N/A) as a surgical intervention.  The patient's history has been reviewed, patient examined, no change in status, stable for surgery.  I have reviewed the patient's chart and labs.  Questions were answered to the patient's satisfaction.     Lesly Rubenstein  Ok to proceed with colonoscopy

## 2020-09-29 NOTE — Anesthesia Preprocedure Evaluation (Signed)
Anesthesia Evaluation  Patient identified by MRN, date of birth, ID band Patient awake    Reviewed: Allergy & Precautions, NPO status , Patient's Chart, lab work & pertinent test results  History of Anesthesia Complications Negative for: history of anesthetic complications  Airway Mallampati: II  TM Distance: >3 FB Neck ROM: Full    Dental  (+) Missing, Poor Dentition   Pulmonary neg pulmonary ROS, neg sleep apnea, neg COPD, Patient abstained from smoking.Not current smoker,    Pulmonary exam normal breath sounds clear to auscultation       Cardiovascular Exercise Tolerance: Good METShypertension, (-) CAD and (-) Past MI + dysrhythmias + pacemaker  Rhythm:Regular Rate:Normal - Systolic murmurs SA node dysfunction s/p PPM. Not pacer dependent   Neuro/Psych PSYCHIATRIC DISORDERS Anxiety Bipolar Disorder negative neurological ROS     GI/Hepatic neg GERD  ,(+)     (-) substance abuse  ,   Endo/Other  diabetes  Renal/GU negative Renal ROS     Musculoskeletal   Abdominal   Peds  Hematology   Anesthesia Other Findings Past Medical History: No date: Anxiety 02/11/2014: Basal cell carcinoma No date: Bipolar disorder ([place])     Comment:  followed by Dr. 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 No date: Vitamin B12 deficiency  Reproductive/Obstetrics                             Anesthesia Physical Anesthesia Plan  ASA: II  Anesthesia Plan: General   Post-op Pain Management:    Induction: Intravenous  PONV Risk Score and Plan: 2 and Ondansetron, Propofol infusion and TIVA  Airway Management Planned: Nasal Cannula  Additional Equipment: None  Intra-op Plan:   Post-operative Plan:   Informed  Consent: I have reviewed the patients History and Physical, chart, labs and discussed the procedure including the risks, benefits and alternatives for the proposed anesthesia with the patient or authorized representative who has indicated his/her understanding and acceptance.     Dental advisory given  Plan Discussed with: CRNA and Surgeon  Anesthesia Plan Comments: (Discussed risks of anesthesia with patient, including possibility of difficulty with spontaneous ventilation under anesthesia necessitating airway intervention, PONV, and rare risks such as cardiac or respiratory or neurological events. Patient understands.)        Anesthesia Quick Evaluation

## 2020-09-29 NOTE — Transfer of Care (Signed)
Immediate Anesthesia Transfer of Care Note  Patient: Michael Bradshaw  Procedure(s) Performed: COLONOSCOPY WITH PROPOFOL (N/A )  Patient Location: PACU  Anesthesia Type:General  Level of Consciousness: awake, alert  and oriented  Airway & Oxygen Therapy: Patient Spontanous Breathing and Patient connected to nasal cannula oxygen  Post-op Assessment: Report given to RN and Post -op Vital signs reviewed and stable  Post vital signs: Reviewed and stable  Last Vitals:  Vitals Value Taken Time  BP 85/65 09/29/20 0832  Temp    Pulse 54 09/29/20 0833  Resp 18 09/29/20 0833  SpO2 93 % 09/29/20 0833  Vitals shown include unvalidated device data.  Last Pain:  Vitals:   09/29/20 0717  TempSrc: Temporal  PainSc: 0-No pain         Complications: No complications documented.

## 2020-09-29 NOTE — Op Note (Signed)
[facility] Patient Name: Michael Bradshaw Procedure Date: 09/29/2020 8:04 AM MRN: [id number] Account #: [id number] Date of Birth: [birth year] Admit Type: Outpatient Age: 74 Room: [facility] ENDO ROOM 1 Gender: Male Note Status: Finalized Procedure:             Colonoscopy Indications:           High risk colon cancer surveillance: Personal history                         of colonic polyps Providers:             Andrey Farmer MD, MD Medicines:             Monitored Anesthesia Care Complications:         No immediate complications. Procedure:             Pre-Anesthesia Assessment:                        - Prior to the procedure, a History and Physical was                         performed, and patient medications and allergies were                         reviewed. The patient is competent. The risks and                         benefits of the procedure and the sedation options and                         risks were discussed with the patient. All questions                         were answered and informed consent was obtained.                         Patient identification and proposed procedure were                         verified by the physician, the nurse, the anesthetist                         and the technician in the endoscopy suite. Mental                         Status Examination: alert and oriented. Airway                         Examination: normal oropharyngeal airway and neck                         mobility. Respiratory Examination: clear to                         auscultation. CV Examination: normal. Prophylactic                         Antibiotics: The patient does not require prophylactic  antibiotics. Prior Anticoagulants: The patient has                         taken no previous anticoagulant or antiplatelet                         agents. ASA Grade Assessment: III - A patient with                          severe systemic disease. After reviewing the risks and                         benefits, the patient was deemed in satisfactory                         condition to undergo the procedure. The anesthesia                         plan was to use monitored anesthesia care (MAC).                         Immediately prior to administration of medications,                         the patient was re-assessed for adequacy to receive                         sedatives. The heart rate, respiratory rate, oxygen                         saturations, blood pressure, adequacy of pulmonary                         ventilation, and response to care were monitored                         throughout the procedure. The physical status of the                         patient was re-assessed after the procedure.                        After obtaining informed consent, the colonoscope was                         passed under direct vision. Throughout the procedure,                         the patient's blood pressure, pulse, and oxygen                         saturations were monitored continuously. The                         Colonoscope was introduced through the anus and                         advanced to the the cecum, identified by appendiceal  orifice and ileocecal valve. The colonoscopy was                         technically difficult and complex due to poor                         endoscopic visualization. The patient tolerated the                         procedure well. The quality of the bowel preparation                         was fair. Findings:      The perianal and digital rectal examinations were normal.      A large amount of liquid stool was found in the entire colon, making       visualization difficult. Lavage of the area was performed using a large       amount, resulting in clearance with fair visualization.      Non-bleeding internal hemorrhoids were found during  retroflexion. The       hemorrhoids were Grade I (internal hemorrhoids that do not prolapse).      The exam was otherwise without abnormality on direct and retroflexion       views. Impression:            - Preparation of the colon was fair.                        - Stool in the entire examined colon requiring large                         amount of lavage for fair visualization.                        - Non-bleeding internal hemorrhoids.                        - The examination was otherwise normal on direct and                         retroflexion views.                        - No specimens collected. Recommendation:        - Discharge patient to home.                        - Resume previous diet.                        - Continue present medications.                        - Repeat colonoscopy in 1 year because the bowel                         preparation was suboptimal for a surveillance exam. No                         large lesions seen but small lesions could potentially  be missed due to prep.                        - Return to referring physician as previously                         scheduled. Procedure Code(s):     --- Professional ---                        J2419, Colorectal cancer screening; colonoscopy on                         individual at high risk Diagnosis Code(s):     --- Professional ---                        Z86.010, Personal history of colonic polyps                        K64.0, First degree hemorrhoids CPT copyright 2019 American Medical [employer]. All rights reserved. The codes documented in this report are preliminary and upon coder review may  be revised to meet current compliance requirements. Andrey Farmer, MD Andrey Farmer MD, MD 09/29/2020 8:30:13 AM Number of Addenda: 0 Note Initiated On: 09/29/2020 8:04 AM Scope Withdrawal Time: 0 hours 8 minutes 13 seconds  Total Procedure Duration: 0 hours 15 minutes 26 seconds   Estimated Blood Loss:  Estimated blood loss: none.      [facility]

## 2020-09-29 NOTE — H&P (Signed)
Outpatient short stay form Pre-procedure 09/29/2020 8:01 AM Raylene Miyamoto MD, MPH  Primary Physician: Dr. Caryl Comes  Reason for visit:  Surveillance Colonoscopy  History of present illness:   74 y/o gentleman with history of bipolar and one small TA on last colonoscopy here for surveillance colonoscopy. No family history of GI malignancies. History of appendectomy. No blood thinners. No new symptoms.    Current Facility-Administered Medications:  .  0.9 %  sodium chloride infusion, , Intravenous, Continuous, Tab Rylee, Hilton Cork, MD, Last Rate: 20 mL/hr at 09/29/20 0735, New Bag at 09/29/20 0735  Medications Prior to Admission  Medication Sig Dispense Refill Last Dose  . empagliflozin (JARDIANCE) 10 MG TABS tablet Take by mouth daily with breakfast.   09/27/20  . lisinopril (ZESTRIL) 40 MG tablet Take 40 mg by mouth in the morning and at bedtime.   09/27/20  . lovastatin (MEVACOR) 40 MG tablet Take 40 mg by mouth at bedtime. Take 2 tablets at night   Past Week at Unknown time  . metFORMIN (GLUCOPHAGE) 1000 MG tablet Take 1,000 mg by mouth 2 (two) times daily with a meal.   09/27/20  . QUEtiapine (SEROQUEL) 300 MG tablet Take 300 mg by mouth at bedtime. Take 0.5 tablets by mouth at bedtime.   09/28/2020 at Unknown time  . vitamin B-12 (CYANOCOBALAMIN) 1000 MCG tablet Take 500 mcg by mouth daily.   09/27/20     Allergies  Allergen Reactions  . Lamictal [Lamotrigine]   . Risperdal [Risperidone]      Past Medical History:  Diagnosis Date  . Anxiety   . Basal cell carcinoma 02/11/2014  . Bipolar disorder ([place])    followed by Dr. Clovis Pu  . Diabetes mellitus without complication ([place])   . History of gallstones   . Hyperlipidemia   . Hypertension   . 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 Vitamin B12 deficiency     Review of systems:  Otherwise negative.    Physical Exam  Gen: Alert, oriented. Appears stated age.  HEENT: PERRLA. Lungs: No  respiratory distress CV: RRR Abd: soft, benign, no masses. Ext: No edema.    Planned procedures: Proceed with colonoscopy. The patient understands the nature of the planned procedure, indications, risks, alternatives and potential complications including but not limited to bleeding, infection, perforation, damage to internal organs and possible oversedation/side effects from anesthesia. The patient agrees and gives consent to proceed.  Please refer to procedure notes for findings, recommendations and patient disposition/instructions.     Raylene Miyamoto MD, MPH Gastroenterology 09/29/2020  8:01 AM

## 2020-09-29 NOTE — Anesthesia Procedure Notes (Signed)
Date/Time: 09/29/2020 8:15 AM Performed by: Nelda Marseille, CRNA Pre-anesthesia Checklist: Patient identified, Emergency Drugs available, Suction available, Patient being monitored and Timeout performed Oxygen Delivery Method: Nasal cannula

## 2020-09-29 NOTE — Anesthesia Postprocedure Evaluation (Signed)
Anesthesia Post Note  Patient: Michael Bradshaw  Procedure(s) Performed: COLONOSCOPY WITH PROPOFOL (N/A )  Patient location during evaluation: Endoscopy Anesthesia Type: General Level of consciousness: awake and alert Pain management: pain level controlled Vital Signs Assessment: post-procedure vital signs reviewed and stable Respiratory status: spontaneous breathing, nonlabored ventilation, respiratory function stable and patient connected to nasal cannula oxygen Cardiovascular status: blood pressure returned to baseline and stable Postop Assessment: no apparent nausea or vomiting Anesthetic complications: no   No complications documented.   Last Vitals:  Vitals:   09/29/20 0840 09/29/20 0850  BP: 99/61 129/81  Pulse: (!) 49 65  Resp: (!) 26 (!) 21  Temp:    SpO2: 95% 96%    Last Pain:  Vitals:   09/29/20 0830  TempSrc: Temporal  PainSc:                  Arita Miss

## 2020-11-05 DIAGNOSIS — R509 Fever, unspecified: Secondary | ICD-10-CM | POA: Diagnosis not present

## 2020-11-05 DIAGNOSIS — Z03818 Encounter for observation for suspected exposure to other biological agents ruled out: Secondary | ICD-10-CM | POA: Diagnosis not present

## 2020-11-05 DIAGNOSIS — J069 Acute upper respiratory infection, unspecified: Secondary | ICD-10-CM | POA: Diagnosis not present

## 2020-12-02 DIAGNOSIS — E782 Mixed hyperlipidemia: Secondary | ICD-10-CM | POA: Diagnosis not present

## 2020-12-02 DIAGNOSIS — E538 Deficiency of other specified B group vitamins: Secondary | ICD-10-CM | POA: Diagnosis not present

## 2020-12-02 DIAGNOSIS — E1122 Type 2 diabetes mellitus with diabetic chronic kidney disease: Secondary | ICD-10-CM | POA: Diagnosis not present

## 2020-12-02 DIAGNOSIS — N1831 Chronic kidney disease, stage 3a: Secondary | ICD-10-CM | POA: Diagnosis not present

## 2020-12-08 DIAGNOSIS — E1122 Type 2 diabetes mellitus with diabetic chronic kidney disease: Secondary | ICD-10-CM | POA: Diagnosis not present

## 2020-12-08 DIAGNOSIS — I129 Hypertensive chronic kidney disease with stage 1 through stage 4 chronic kidney disease, or unspecified chronic kidney disease: Secondary | ICD-10-CM | POA: Diagnosis not present

## 2020-12-08 DIAGNOSIS — I495 Sick sinus syndrome: Secondary | ICD-10-CM | POA: Diagnosis not present

## 2020-12-08 DIAGNOSIS — Z89421 Acquired absence of other right toe(s): Secondary | ICD-10-CM | POA: Diagnosis not present

## 2020-12-08 DIAGNOSIS — F319 Bipolar disorder, unspecified: Secondary | ICD-10-CM | POA: Diagnosis not present

## 2020-12-08 DIAGNOSIS — N1831 Chronic kidney disease, stage 3a: Secondary | ICD-10-CM | POA: Diagnosis not present

## 2020-12-08 DIAGNOSIS — E782 Mixed hyperlipidemia: Secondary | ICD-10-CM | POA: Diagnosis not present

## 2020-12-08 DIAGNOSIS — E538 Deficiency of other specified B group vitamins: Secondary | ICD-10-CM | POA: Diagnosis not present

## 2021-03-16 DIAGNOSIS — N1831 Chronic kidney disease, stage 3a: Secondary | ICD-10-CM | POA: Diagnosis not present

## 2021-03-16 DIAGNOSIS — E782 Mixed hyperlipidemia: Secondary | ICD-10-CM | POA: Diagnosis not present

## 2021-03-16 DIAGNOSIS — N183 Chronic kidney disease, stage 3 unspecified: Secondary | ICD-10-CM | POA: Diagnosis not present

## 2021-03-16 DIAGNOSIS — E1122 Type 2 diabetes mellitus with diabetic chronic kidney disease: Secondary | ICD-10-CM | POA: Diagnosis not present

## 2021-03-23 DIAGNOSIS — N1831 Chronic kidney disease, stage 3a: Secondary | ICD-10-CM | POA: Diagnosis not present

## 2021-03-23 DIAGNOSIS — E1122 Type 2 diabetes mellitus with diabetic chronic kidney disease: Secondary | ICD-10-CM | POA: Diagnosis not present

## 2021-03-23 DIAGNOSIS — Z950 Presence of cardiac pacemaker: Secondary | ICD-10-CM | POA: Diagnosis not present

## 2021-03-23 DIAGNOSIS — E538 Deficiency of other specified B group vitamins: Secondary | ICD-10-CM | POA: Diagnosis not present

## 2021-03-23 DIAGNOSIS — Z89421 Acquired absence of other right toe(s): Secondary | ICD-10-CM | POA: Diagnosis not present

## 2021-03-23 DIAGNOSIS — I129 Hypertensive chronic kidney disease with stage 1 through stage 4 chronic kidney disease, or unspecified chronic kidney disease: Secondary | ICD-10-CM | POA: Diagnosis not present

## 2021-03-23 DIAGNOSIS — E782 Mixed hyperlipidemia: Secondary | ICD-10-CM | POA: Diagnosis not present

## 2021-03-23 DIAGNOSIS — F319 Bipolar disorder, unspecified: Secondary | ICD-10-CM | POA: Diagnosis not present

## 2021-06-10 DIAGNOSIS — H353131 Nonexudative age-related macular degeneration, bilateral, early dry stage: Secondary | ICD-10-CM | POA: Diagnosis not present

## 2021-06-10 DIAGNOSIS — E119 Type 2 diabetes mellitus without complications: Secondary | ICD-10-CM | POA: Diagnosis not present

## 2021-06-10 DIAGNOSIS — H2513 Age-related nuclear cataract, bilateral: Secondary | ICD-10-CM | POA: Diagnosis not present

## 2021-07-22 DIAGNOSIS — E782 Mixed hyperlipidemia: Secondary | ICD-10-CM | POA: Diagnosis not present

## 2021-07-22 DIAGNOSIS — E538 Deficiency of other specified B group vitamins: Secondary | ICD-10-CM | POA: Diagnosis not present

## 2021-07-22 DIAGNOSIS — N1831 Chronic kidney disease, stage 3a: Secondary | ICD-10-CM | POA: Diagnosis not present

## 2021-07-22 DIAGNOSIS — E1122 Type 2 diabetes mellitus with diabetic chronic kidney disease: Secondary | ICD-10-CM | POA: Diagnosis not present

## 2021-07-29 DIAGNOSIS — E782 Mixed hyperlipidemia: Secondary | ICD-10-CM | POA: Diagnosis not present

## 2021-07-29 DIAGNOSIS — F319 Bipolar disorder, unspecified: Secondary | ICD-10-CM | POA: Diagnosis not present

## 2021-07-29 DIAGNOSIS — E1122 Type 2 diabetes mellitus with diabetic chronic kidney disease: Secondary | ICD-10-CM | POA: Diagnosis not present

## 2021-07-29 DIAGNOSIS — E538 Deficiency of other specified B group vitamins: Secondary | ICD-10-CM | POA: Diagnosis not present

## 2021-07-29 DIAGNOSIS — Z89421 Acquired absence of other right toe(s): Secondary | ICD-10-CM | POA: Diagnosis not present

## 2021-07-29 DIAGNOSIS — N1831 Chronic kidney disease, stage 3a: Secondary | ICD-10-CM | POA: Diagnosis not present

## 2021-07-29 DIAGNOSIS — I10 Essential (primary) hypertension: Secondary | ICD-10-CM | POA: Diagnosis not present

## 2021-07-29 DIAGNOSIS — Z0000 Encounter for general adult medical examination without abnormal findings: Secondary | ICD-10-CM | POA: Diagnosis not present

## 2021-10-20 DIAGNOSIS — E1122 Type 2 diabetes mellitus with diabetic chronic kidney disease: Secondary | ICD-10-CM | POA: Diagnosis not present

## 2021-10-20 DIAGNOSIS — E782 Mixed hyperlipidemia: Secondary | ICD-10-CM | POA: Diagnosis not present

## 2021-10-20 DIAGNOSIS — N1831 Chronic kidney disease, stage 3a: Secondary | ICD-10-CM | POA: Diagnosis not present

## 2021-10-28 DIAGNOSIS — E1122 Type 2 diabetes mellitus with diabetic chronic kidney disease: Secondary | ICD-10-CM | POA: Diagnosis not present

## 2021-10-28 DIAGNOSIS — E538 Deficiency of other specified B group vitamins: Secondary | ICD-10-CM | POA: Diagnosis not present

## 2021-10-28 DIAGNOSIS — I129 Hypertensive chronic kidney disease with stage 1 through stage 4 chronic kidney disease, or unspecified chronic kidney disease: Secondary | ICD-10-CM | POA: Diagnosis not present

## 2021-10-28 DIAGNOSIS — Z23 Encounter for immunization: Secondary | ICD-10-CM | POA: Diagnosis not present

## 2021-10-28 DIAGNOSIS — Z89421 Acquired absence of other right toe(s): Secondary | ICD-10-CM | POA: Diagnosis not present

## 2021-10-28 DIAGNOSIS — F319 Bipolar disorder, unspecified: Secondary | ICD-10-CM | POA: Diagnosis not present

## 2021-10-28 DIAGNOSIS — N1831 Chronic kidney disease, stage 3a: Secondary | ICD-10-CM | POA: Diagnosis not present

## 2021-10-28 DIAGNOSIS — E782 Mixed hyperlipidemia: Secondary | ICD-10-CM | POA: Diagnosis not present
